# Patient Record
Sex: Female | Born: 1975 | Race: White | Hispanic: No | Marital: Married
Health system: Southern US, Community
[De-identification: ages and names within clinical notes are randomized; demographics above are authoritative.]

---

## 2003-07-15 ENCOUNTER — Emergency Department (HOSPITAL_COMMUNITY): Admission: EM | Admit: 2003-07-15 | Discharge: 2003-07-15 | Payer: Self-pay | Admitting: *Deleted

## 2004-12-01 ENCOUNTER — Emergency Department: Payer: Self-pay | Admitting: Emergency Medicine

## 2005-02-26 ENCOUNTER — Emergency Department (HOSPITAL_COMMUNITY): Admission: EM | Admit: 2005-02-26 | Discharge: 2005-02-26 | Payer: Self-pay | Admitting: Emergency Medicine

## 2005-02-27 ENCOUNTER — Ambulatory Visit: Payer: Self-pay | Admitting: Psychiatry

## 2005-02-27 ENCOUNTER — Inpatient Hospital Stay (HOSPITAL_COMMUNITY): Admission: EM | Admit: 2005-02-27 | Discharge: 2005-03-02 | Payer: Self-pay | Admitting: Psychiatry

## 2005-12-09 ENCOUNTER — Emergency Department: Payer: Self-pay | Admitting: Emergency Medicine

## 2006-03-02 ENCOUNTER — Inpatient Hospital Stay (HOSPITAL_COMMUNITY): Admission: RE | Admit: 2006-03-02 | Discharge: 2006-03-06 | Payer: Self-pay | Admitting: Psychiatry

## 2006-03-03 ENCOUNTER — Ambulatory Visit: Payer: Self-pay | Admitting: Psychiatry

## 2006-05-20 ENCOUNTER — Ambulatory Visit: Payer: Self-pay | Admitting: Obstetrics and Gynecology

## 2007-03-09 ENCOUNTER — Emergency Department: Payer: Self-pay | Admitting: Emergency Medicine

## 2007-03-22 ENCOUNTER — Emergency Department: Payer: Self-pay | Admitting: Emergency Medicine

## 2007-05-18 ENCOUNTER — Observation Stay: Payer: Self-pay

## 2007-07-04 ENCOUNTER — Encounter: Payer: Self-pay | Admitting: Maternal & Fetal Medicine

## 2007-07-14 ENCOUNTER — Observation Stay: Payer: Self-pay | Admitting: Unknown Physician Specialty

## 2007-11-28 ENCOUNTER — Emergency Department: Payer: Self-pay | Admitting: Emergency Medicine

## 2008-03-10 ENCOUNTER — Emergency Department: Payer: Self-pay | Admitting: Emergency Medicine

## 2008-03-12 ENCOUNTER — Emergency Department: Payer: Self-pay | Admitting: Emergency Medicine

## 2008-04-23 ENCOUNTER — Emergency Department: Payer: Self-pay | Admitting: Emergency Medicine

## 2008-04-24 ENCOUNTER — Emergency Department: Payer: Self-pay | Admitting: Emergency Medicine

## 2008-07-11 ENCOUNTER — Emergency Department: Payer: Self-pay | Admitting: Emergency Medicine

## 2008-07-16 ENCOUNTER — Emergency Department: Payer: Self-pay | Admitting: Emergency Medicine

## 2008-07-25 ENCOUNTER — Emergency Department: Payer: Self-pay | Admitting: Emergency Medicine

## 2008-09-23 ENCOUNTER — Emergency Department: Payer: Self-pay | Admitting: Emergency Medicine

## 2008-10-12 ENCOUNTER — Emergency Department: Payer: Self-pay | Admitting: Emergency Medicine

## 2009-01-23 ENCOUNTER — Emergency Department: Payer: Self-pay | Admitting: Emergency Medicine

## 2010-05-07 ENCOUNTER — Emergency Department: Payer: Self-pay | Admitting: Emergency Medicine

## 2010-05-15 ENCOUNTER — Emergency Department: Payer: Self-pay | Admitting: Emergency Medicine

## 2010-06-03 ENCOUNTER — Emergency Department: Payer: Self-pay | Admitting: Emergency Medicine

## 2010-07-12 ENCOUNTER — Emergency Department: Payer: Self-pay | Admitting: Emergency Medicine

## 2010-08-11 ENCOUNTER — Emergency Department: Payer: Self-pay | Admitting: Unknown Physician Specialty

## 2011-01-11 ENCOUNTER — Emergency Department: Payer: Self-pay | Admitting: Unknown Physician Specialty

## 2011-04-26 ENCOUNTER — Emergency Department: Payer: Self-pay | Admitting: Emergency Medicine

## 2011-06-02 ENCOUNTER — Inpatient Hospital Stay: Payer: Self-pay | Admitting: Psychiatry

## 2011-07-05 ENCOUNTER — Emergency Department: Payer: Self-pay | Admitting: Emergency Medicine

## 2011-08-07 ENCOUNTER — Emergency Department: Payer: Self-pay | Admitting: *Deleted

## 2011-09-22 ENCOUNTER — Emergency Department: Payer: Self-pay | Admitting: Unknown Physician Specialty

## 2011-10-02 ENCOUNTER — Inpatient Hospital Stay: Payer: Self-pay | Admitting: Obstetrics and Gynecology

## 2012-01-24 ENCOUNTER — Observation Stay: Payer: Self-pay

## 2012-01-24 LAB — URINALYSIS, COMPLETE
Blood: NEGATIVE
Glucose,UR: NEGATIVE mg/dL (ref 0–75)
Ph: 6 (ref 4.5–8.0)
Protein: NEGATIVE
RBC,UR: 2 /HPF (ref 0–5)
Specific Gravity: 1.014 (ref 1.003–1.030)
Squamous Epithelial: 9
WBC UR: 4 /HPF (ref 0–5)

## 2012-02-03 IMAGING — CT CT STONE STUDY
1 of 2 series · 15 of 32 positions shown, 19 images · non-contrast
Comparison: none

REASON FOR EXAM: back pain radiating around to l flank, dysuria
COMMENTS:

[Series 2: stone · axial · 0.83mm/px · z∈[+284,+732]mm · 15 of 163 slices shown, 19 images]
[im 7/163  soft-tissue]
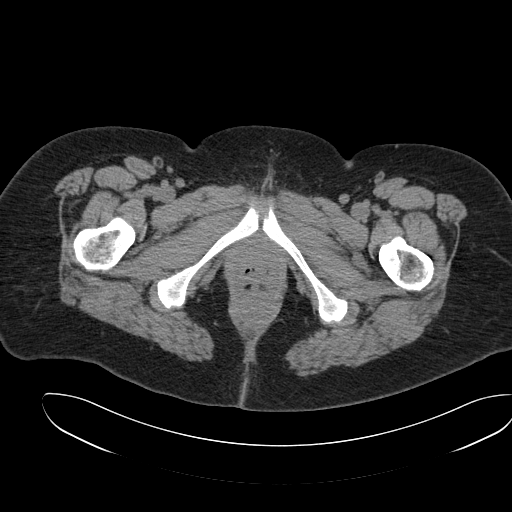
[im 7/163  bone]
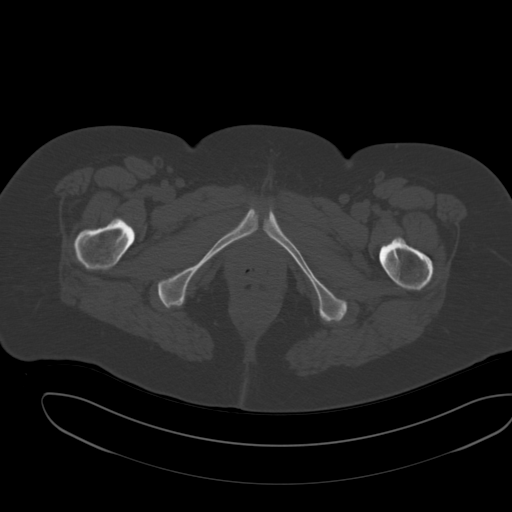
[im 20/163  soft-tissue]
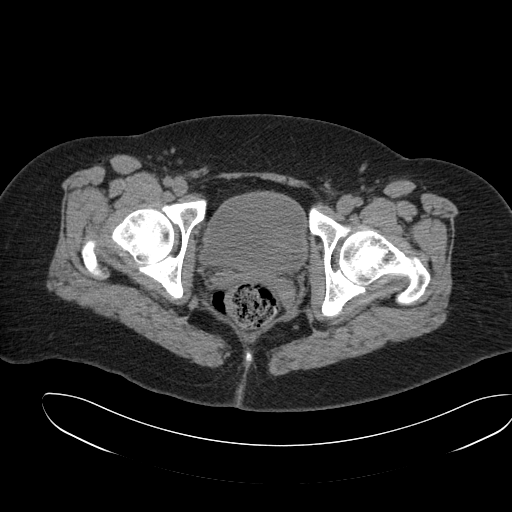
[im 33/163  soft-tissue]
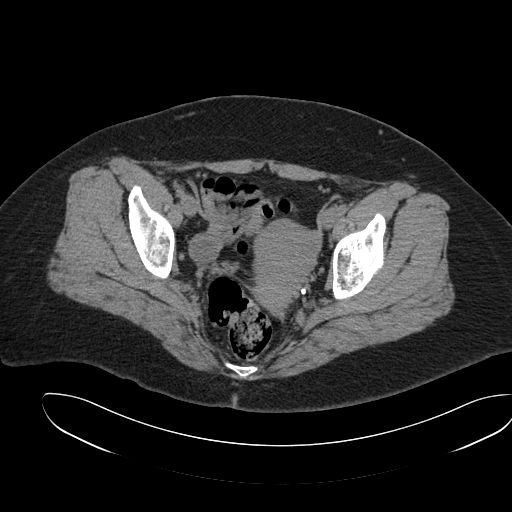
[im 46/163  soft-tissue]
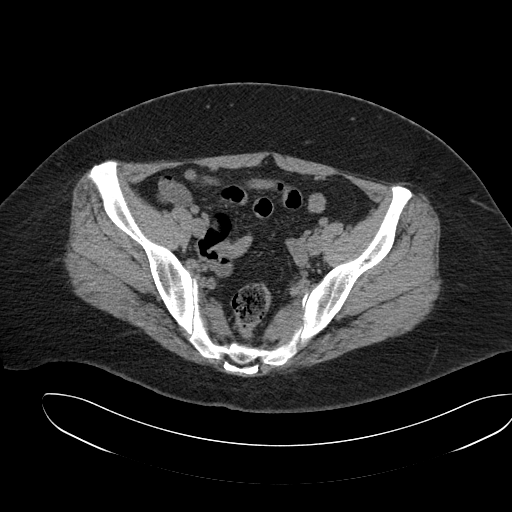
[im 59/163  soft-tissue]
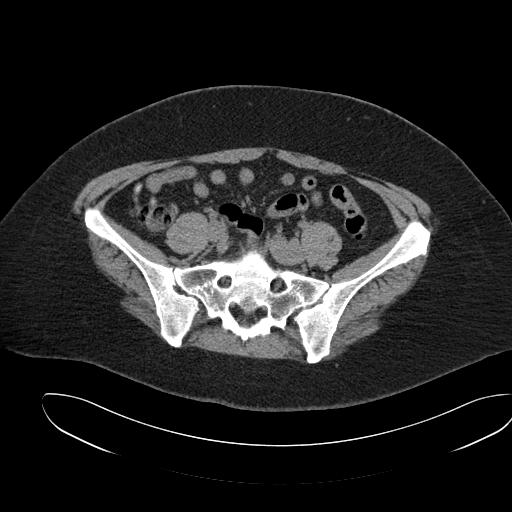
[im 72/163  soft-tissue]
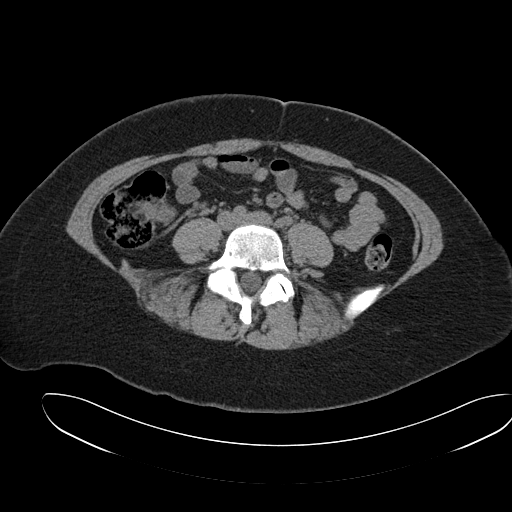
[im 85/163  soft-tissue]
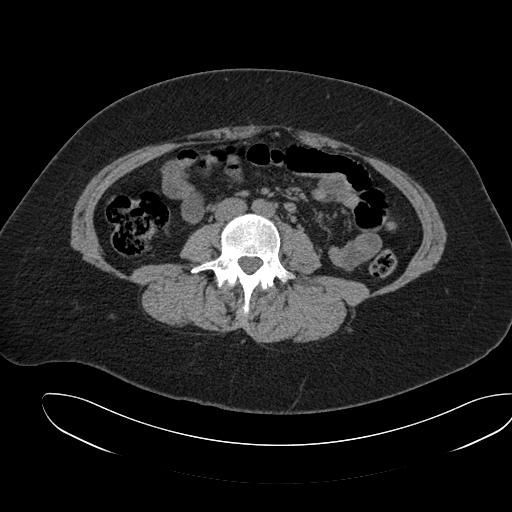
[im 91/163  soft-tissue]
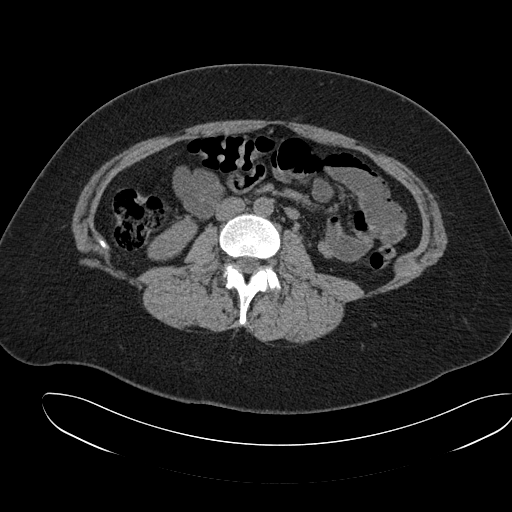
[im 104/163  soft-tissue]
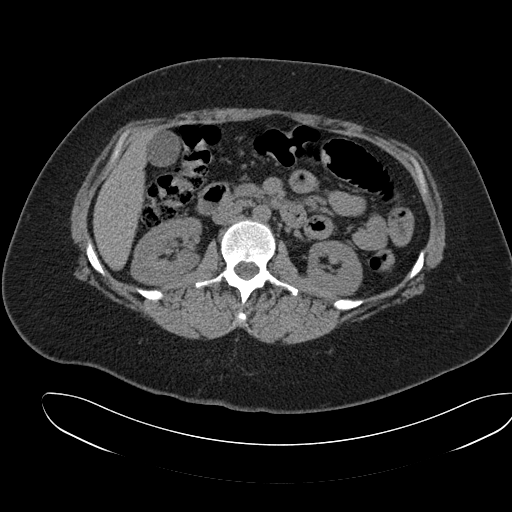
[im 104/163  bone]
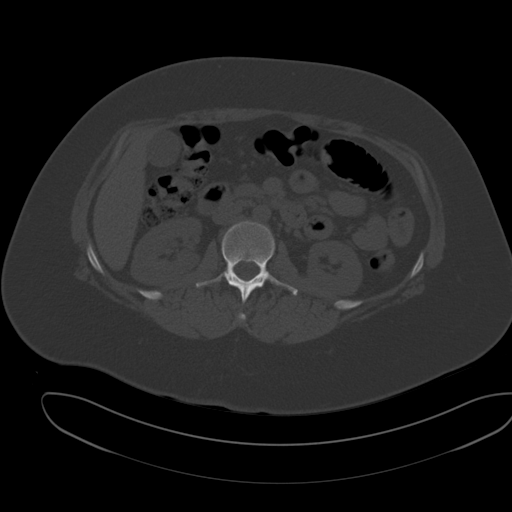
[im 117/163  soft-tissue]
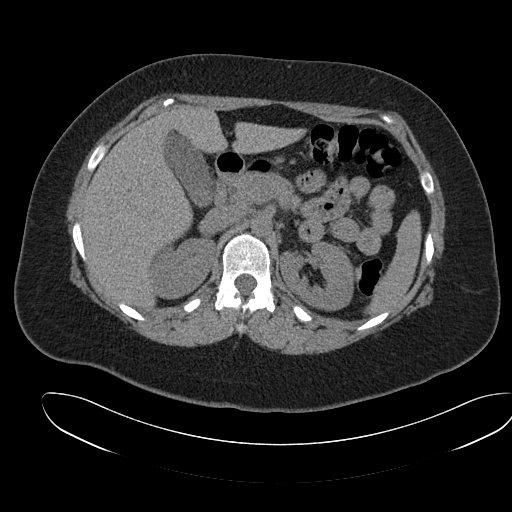
[im 130/163  soft-tissue]
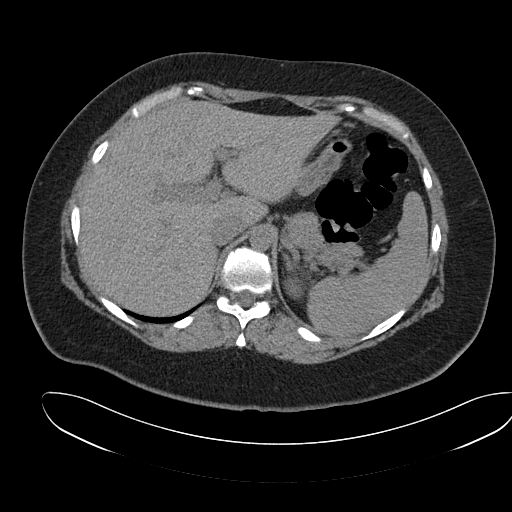
[im 137/163  lung]
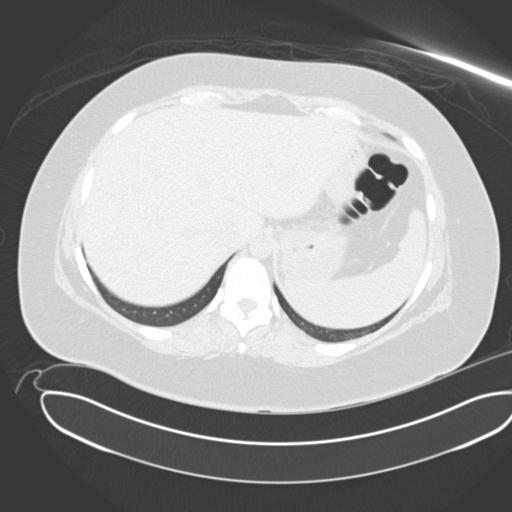
[im 143/163  soft-tissue]
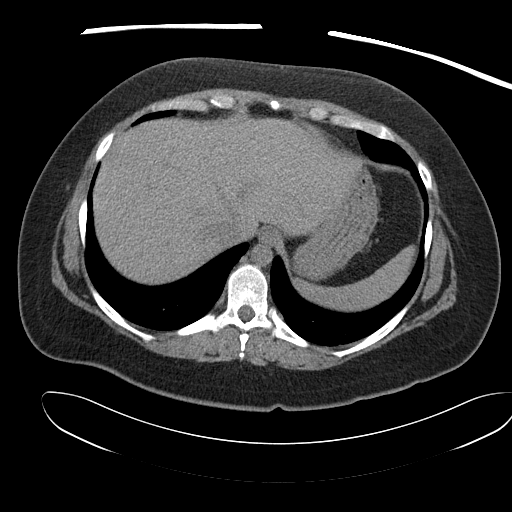
[im 143/163  lung]
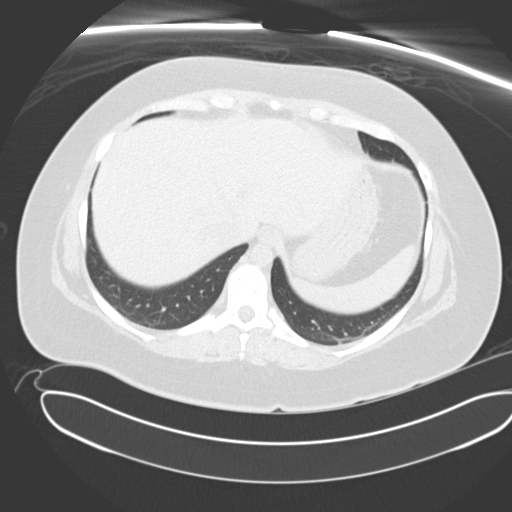
[im 150/163  lung]
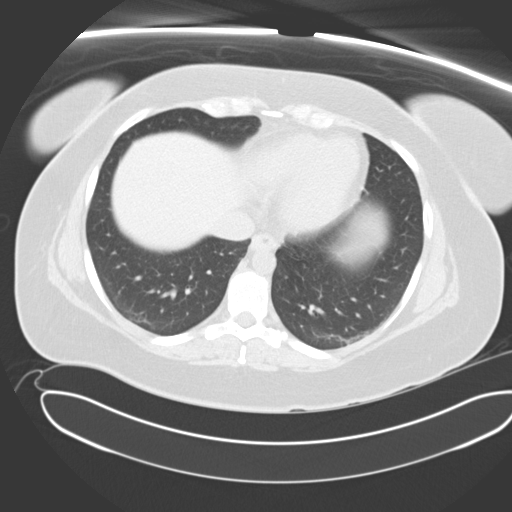
[im 156/163  soft-tissue]
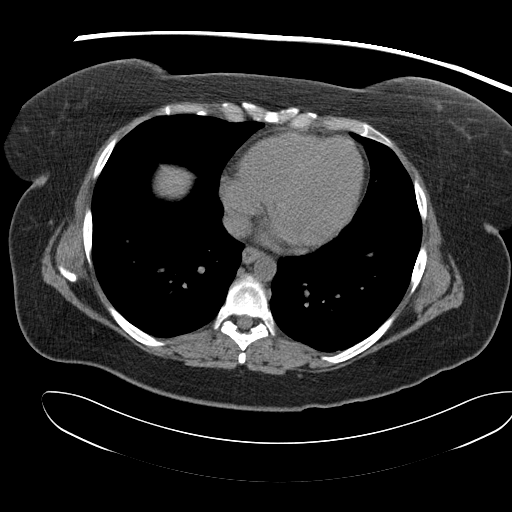
[im 156/163  lung]
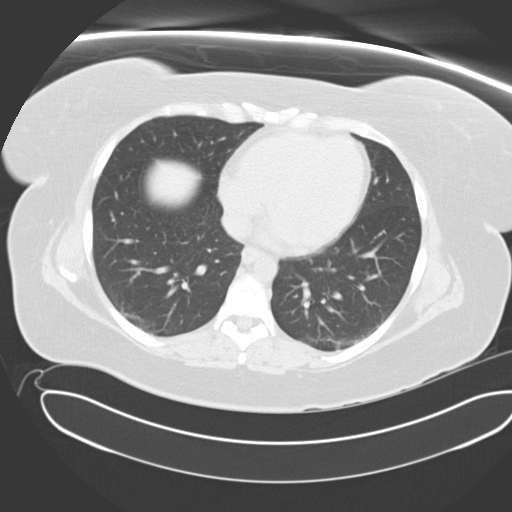

[15 of 32 positions shown; findings below may reference images not displayed]

PROCEDURE:     CT  - CT ABDOMEN /PELVIS WO (STONE)  - July 12, 2010  [DATE]

RESULT:     Axial noncontrast CT scanning was performed through the abdomen
and pelvis at 3 mm intervals and slice thicknesses. Review of multiplanar
reconstructed images was performed separately on the VIA monitor.

The kidneys exhibit no evidence of obstruction nor of calcified stones. The
perinephric fat is normal in density with no abnormal fluid collections or
inflammatory type changes identified. Along the expected course of the
ureters I see no calcified stones. The partially distended urinary bladder
is normal in appearance.

On the right in the adnexal region there is a 3 cm diameter hypodensity
which may reflect the clinically known ovarian cyst. I do not see and
adnexal mass on the left. The uterus is situated slightly to the left of
midline but exhibits no acute abnormality. I see no free fluid within the
abdomen or pelvis. There is no pelvic sidewall lymphadenopathy.

The unopacified loops of small and large bowel exhibit no evidence of
obstruction. A normal appendix is identified. There are no findings to
suggest colitis. There is no inguinal nor umbilical hernia. The liver,
gallbladder, pancreas, spleen, adrenal glands, and periaortic and pericaval
regions are normal in appearance. The lung bases exhibit no acute
abnormality. The lumbar vertebral bodies are preserved in height.
IMPRESSION: 1. I do not see evidence of acute urinary tract abnormality.
2. There is no evidence of acute hepatobiliary abnormality nor acute bowel
abnormality.
3. There is a 3 cm diameter right adnexal soft tissue mass which may reflect
the clinically known ovarian cyst. This could be evaluated further with
ultrasound.

A preliminary report was sent to the [HOSPITAL] the conclusion
of the study.

## 2012-02-10 ENCOUNTER — Observation Stay: Payer: Self-pay

## 2012-02-10 LAB — URINALYSIS, COMPLETE
Leukocyte Esterase: NEGATIVE
Ph: 5 (ref 4.5–8.0)
Protein: 30
RBC,UR: 6 /HPF (ref 0–5)

## 2012-02-11 LAB — CBC WITH DIFFERENTIAL/PLATELET
Basophil #: 0 10*3/uL (ref 0.0–0.1)
Eosinophil #: 0.1 10*3/uL (ref 0.0–0.7)
HCT: 34.1 % — ABNORMAL LOW (ref 35.0–47.0)
Lymphocyte #: 1.6 10*3/uL (ref 1.0–3.6)
MCH: 33.8 pg (ref 26.0–34.0)
MCHC: 34.3 g/dL (ref 32.0–36.0)
MCV: 99 fL (ref 80–100)
Monocyte #: 0.4 10*3/uL (ref 0.0–0.7)
Monocyte %: 6.5 %
Neutrophil #: 4 10*3/uL (ref 1.4–6.5)
Neutrophil %: 66 %
Platelet: 129 10*3/uL — ABNORMAL LOW (ref 150–440)
RBC: 3.47 10*6/uL — ABNORMAL LOW (ref 3.80–5.20)
RDW: 13.9 % (ref 11.5–14.5)
WBC: 6.1 10*3/uL (ref 3.6–11.0)

## 2012-02-11 LAB — DRUG SCREEN, URINE
Amphetamines, Ur Screen: NEGATIVE (ref ?–1000)
Barbiturates, Ur Screen: NEGATIVE (ref ?–200)
Benzodiazepine, Ur Scrn: POSITIVE (ref ?–200)
Cocaine Metabolite,Ur ~~LOC~~: NEGATIVE (ref ?–300)
Methadone, Ur Screen: NEGATIVE (ref ?–300)
Opiate, Ur Screen: POSITIVE (ref ?–300)
Tricyclic, Ur Screen: NEGATIVE (ref ?–1000)

## 2012-02-11 LAB — RAPID HIV-1/2 QL/CONFIRM: HIV-1/2,Rapid Ql: NEGATIVE

## 2012-02-11 LAB — ETHANOL: Ethanol: <3 mg/dL

## 2012-02-15 ENCOUNTER — Inpatient Hospital Stay: Payer: Self-pay

## 2012-02-15 LAB — CBC WITH DIFFERENTIAL/PLATELET
Basophil %: 0.1 %
Eosinophil #: 0 10*3/uL (ref 0.0–0.7)
HGB: 12.6 g/dL (ref 12.0–16.0)
Lymphocyte %: 16.2 %
MCHC: 33.7 g/dL (ref 32.0–36.0)
Monocyte #: 0.5 10*3/uL (ref 0.0–0.7)
Neutrophil #: 6.3 10*3/uL (ref 1.4–6.5)
Platelet: 130 10*3/uL — ABNORMAL LOW (ref 150–440)
RDW: 13.6 % (ref 11.5–14.5)
WBC: 8.2 10*3/uL (ref 3.6–11.0)

## 2012-02-15 LAB — DRUG SCREEN, URINE
Barbiturates, Ur Screen: NEGATIVE (ref ?–200)
Benzodiazepine, Ur Scrn: POSITIVE (ref ?–200)
Cannabinoid 50 Ng, Ur ~~LOC~~: NEGATIVE (ref ?–50)
MDMA (Ecstasy)Ur Screen: NEGATIVE (ref ?–500)
Opiate, Ur Screen: POSITIVE (ref ?–300)

## 2013-04-15 IMAGING — US US OB US >=[ID] SNGL FETUS
1 series · 17 of 28 positions shown · non-contrast
Comparison: none

RESULT:      OB ultrasound of the pelvis demonstrates a single intrauterine
fetus. The placenta is anterior in position. Fetal cardiac activity is
measured at 141 beats per minute. The length of the cervix as measured at
4.17 cm. The inferior margin of the placenta is 2.54 cm away from the
internal cervical os. Fetal anatomy appears to be grossly normal. There is a
cephalic presentation at the time of imaging. The amniotic fluid volume is
visually normal. The length of the cervix after voiding was measured at
cm. Two umbilical arteries are present. Gestational age sonographically is
19 weeks 2 days plus or minus a standard deviation of 10 to 14 days.
Ultrasound EDD is 02/14/2012. Current estimated fetal weight is 312 grams + / -
42 grams.

[Series 1: us ob us >=(id) sngl fetus · 17 of 76 slices shown]
[im 1/76]
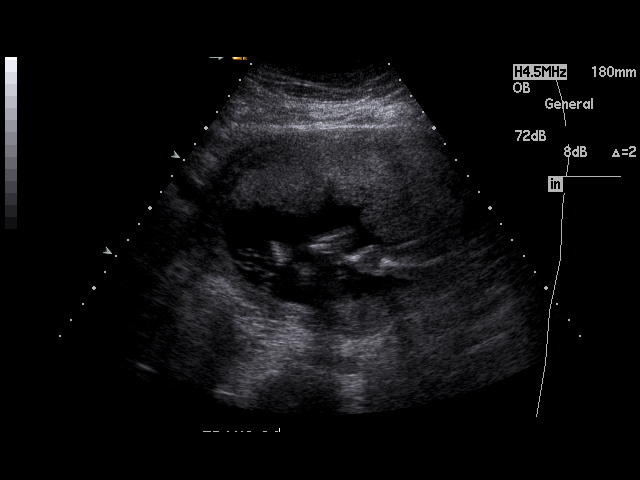
[im 6/76]
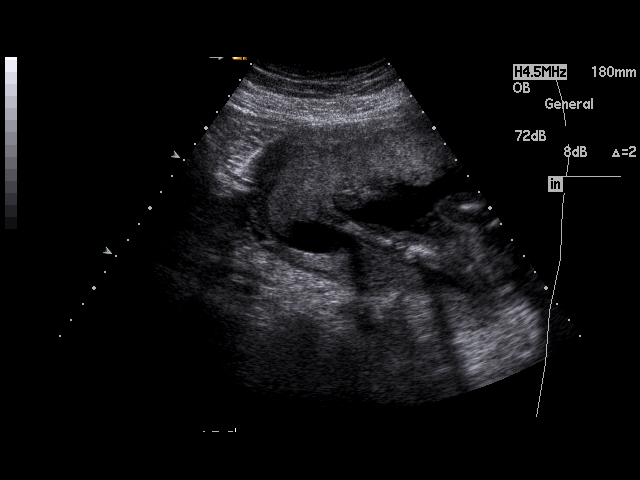
[im 12/76]
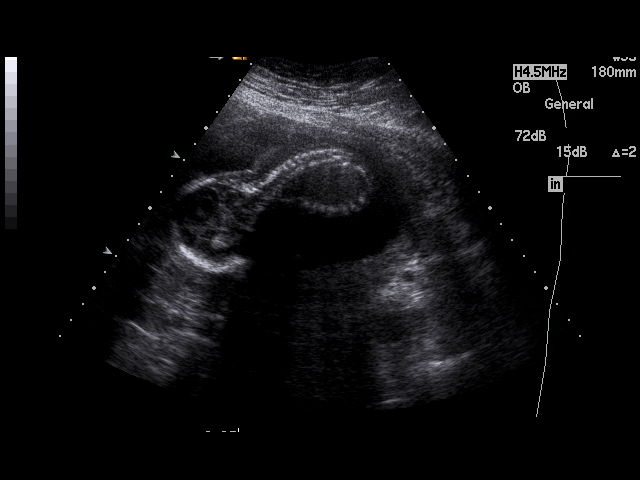
[im 14/76]
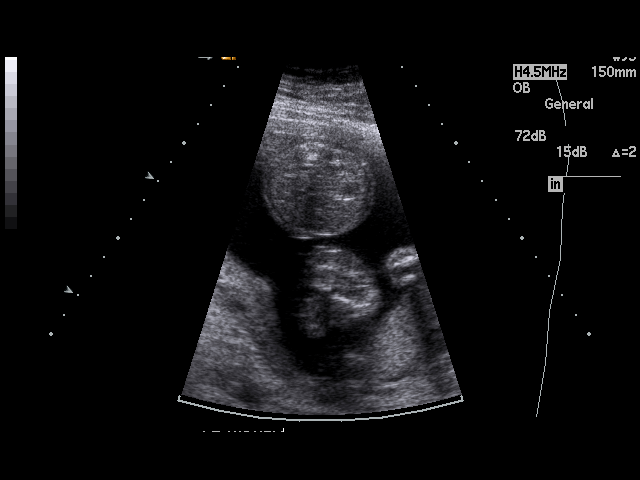
[im 20/76]
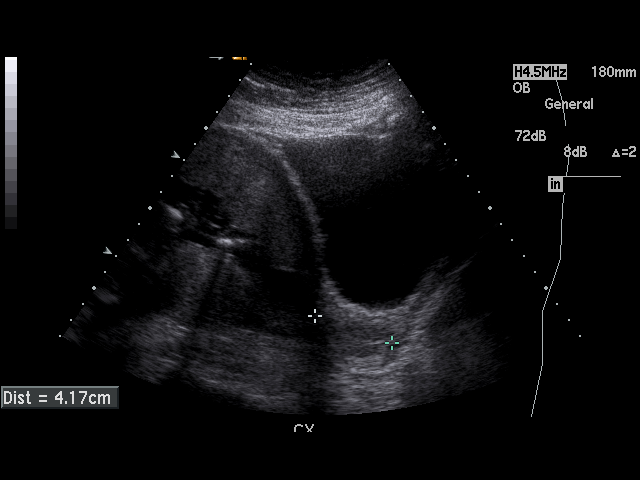
[im 26/76]
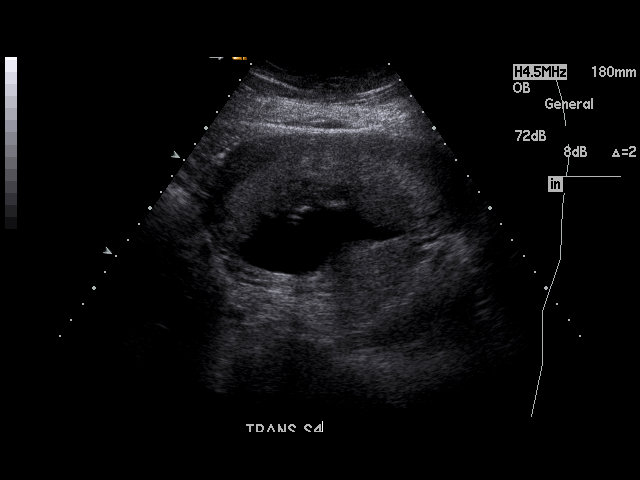
[im 28/76]
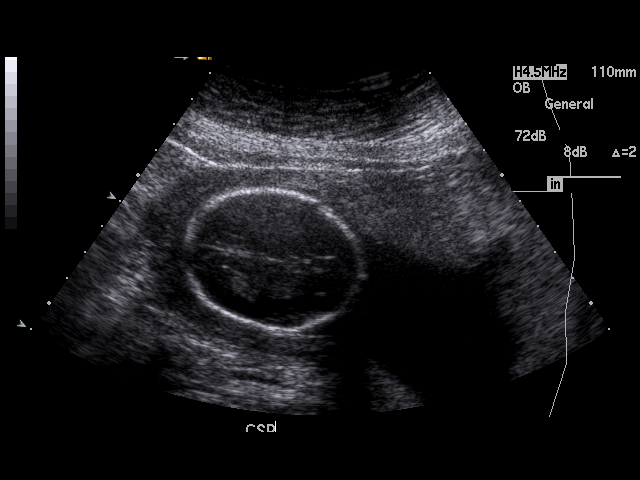
[im 34/76]
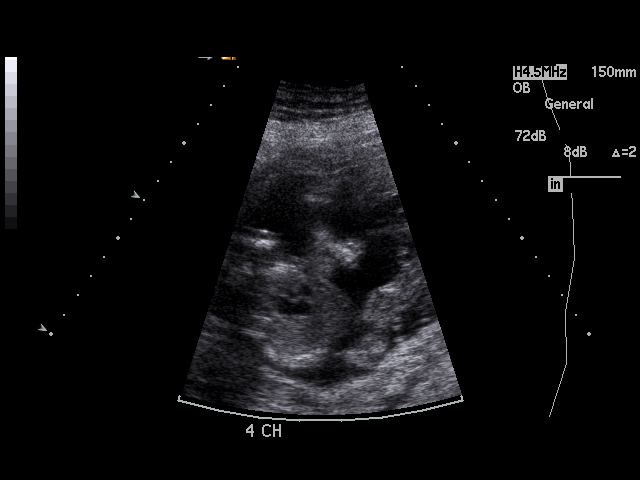
[im 39/76]
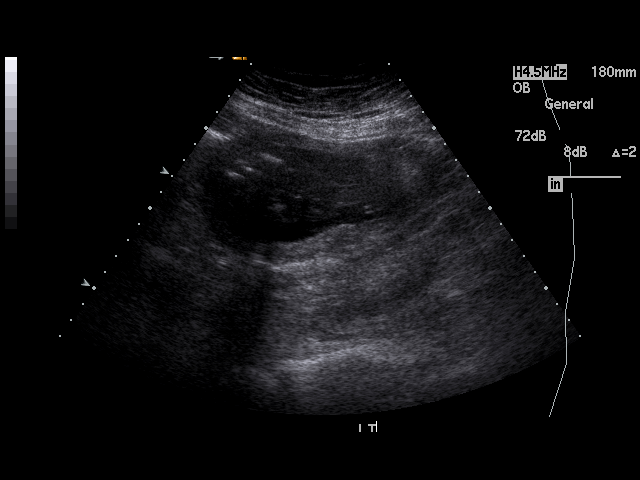
[im 42/76]
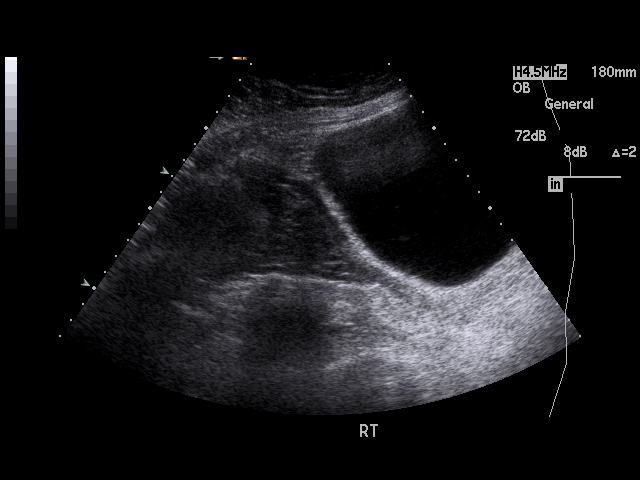
[im 48/76]
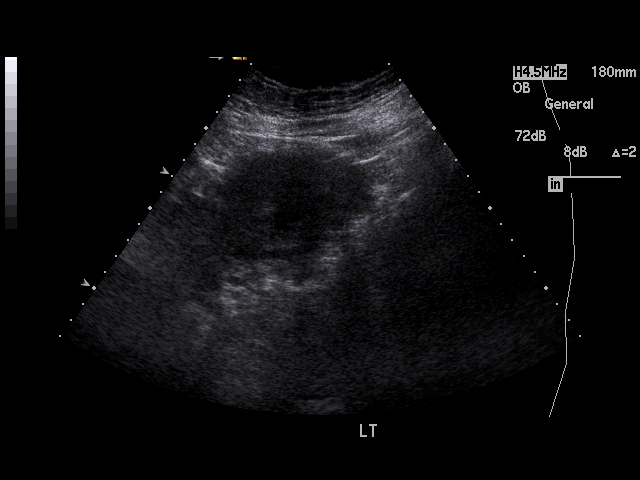
[im 51/76]
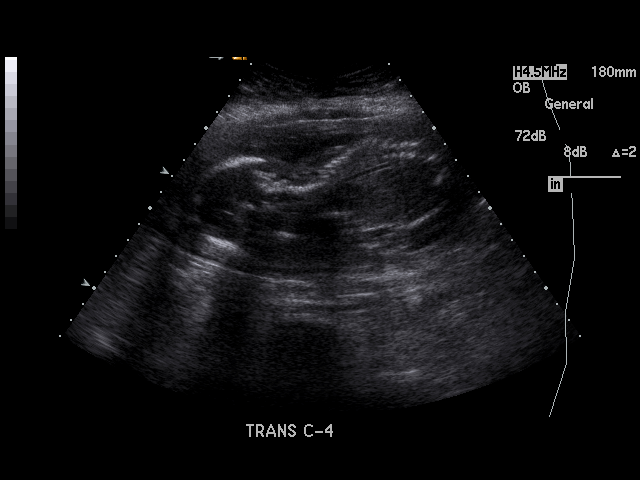
[im 56/76]
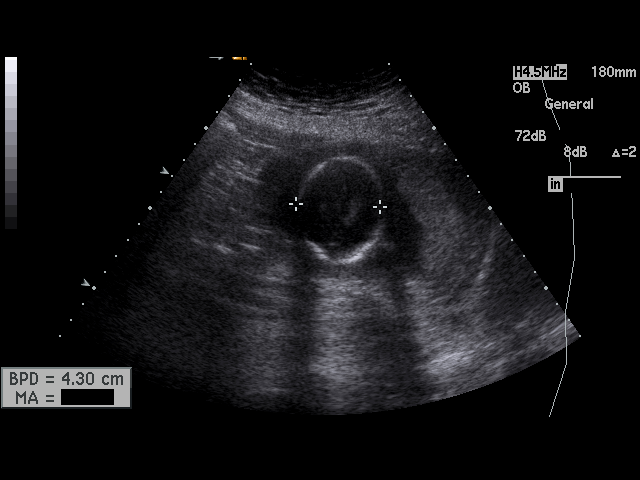
[im 62/76]
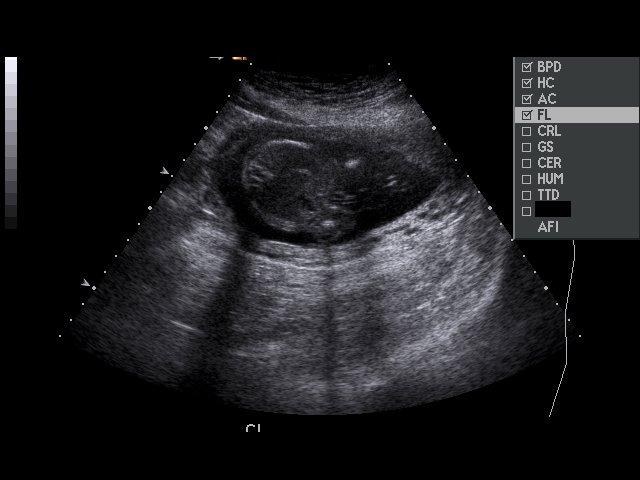
[im 64/76]
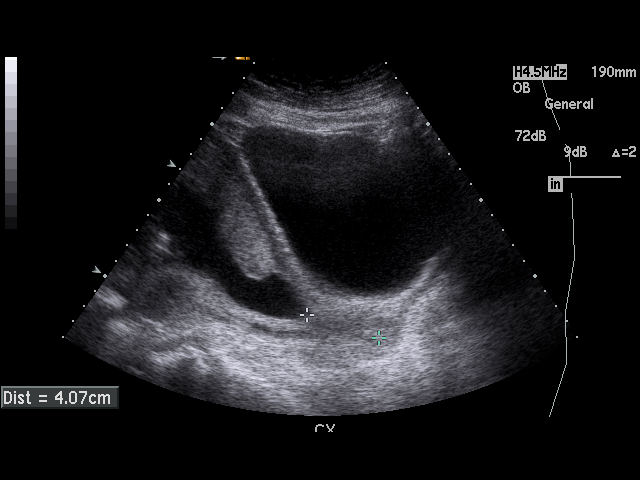
[im 70/76]
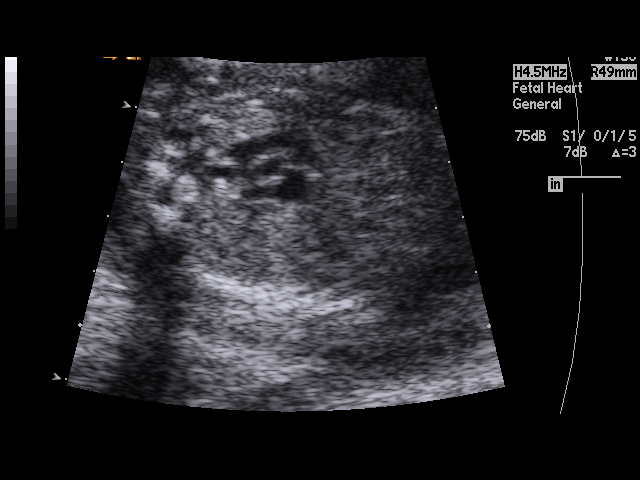
[im 76/76]
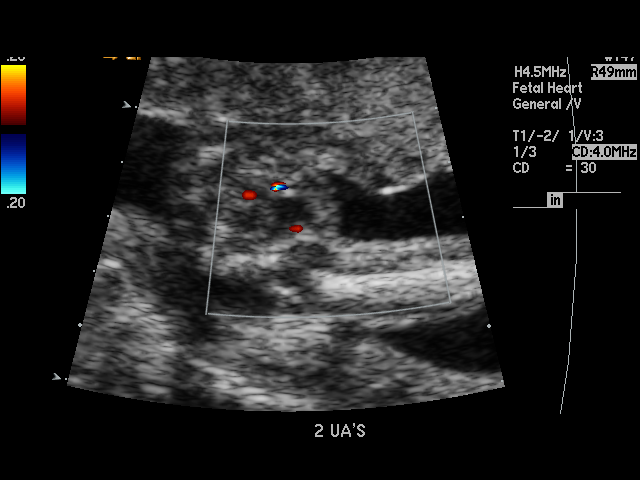

[17 of 28 positions shown; findings below may reference images not displayed]

IMPRESSION: No significant abnormality evident.
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
## 2015-03-24 NOTE — Consult Note (Signed)
Patient is clear for discharge and baby care from psych perspective.meds and full note (to follow).  Electronic Signatures: Lester CarolinaWilliford, Mayukha Symmonds S (MD)  (Signed on 19-Mar-13 19:00)  Authored  Last Updated: 19-Mar-13 19:00 by Lester CarolinaWilliford, Shanelle Clontz S (MD)

## 2015-03-24 NOTE — Discharge Summary (Signed)
PATIENT NAME:  Krista Nicholson, Krista Nicholson MR#:  [id number] DATE OF BIRTH:  Jun 05, 1976  DATE OF ADMISSION:  02/10/2012 DATE OF DISCHARGE:  02/11/2012  HOSPITAL COURSE: The patient was admitted late last evening for reports of spotting, was found to be in a drug-induced stupor with positive benzos, opioids, and marijuana.   She has had three prenatal visits, all through our Emergency Room beginning in December, once in February and then again last evening. She has been seen by Dr. Greggory KeeneFrancesco, then Acquanetta BellingAngela Lugiano, CNM the second time, initially Dr. Greggory KeeneFrancesco last evening and then he insisted she be transferred to the [facility] service since we saw her last.   On presentation she was stuporous. The nurses on Labor and Delivery were concerned because they weren't sure if she was breathing regularly or not despite a good pulse oximetry. Infant was reassuring and, therefore, hospitalist were consulted and she was transferred to telemetry.   Throughout the day today, then talking with [facility] and the social workers trying to decide what to do with this lady with what appears to be a 39 week, 2 day gestation (based on 20 week ultrasound). The patient is in denial of drug abuse but states she has been trying to get in touch with [facility] throughout and is interested in becoming established.   Social Worker spoke with her today and the patient was quite upset of the insinuation that she was abusing drugs saying that "all of my medicines are prescription".   This afternoon she was transferred back up to Labor and Delivery. Fetal monitoring shows no significant contractions. Fetal heart tracing is reactive and cervical exam shows her to be 2/50/high. There is some old dark mucous consistent with old blood. No active bleeding noted.   I discussed with the patient about ideally we would get her involved in some sort of drug program soon, like as in this weekend, and considering delivering [facility] next week[facility]. She is very interested in doing this.   I offered to keep her until in the morning so we can get her all established before she leaves, however, she is very concerned about her children at home and wants to get home. She feels like she needs to make it clear to them that they are going to have another baby. She doesn't feel that they understand that.   She is accompanied by her sister who swears that "she won't do nothing while I'm there". Krista Nicholson has verbally contracted with me that the do not take anything outside of prescribed medications at home in any other manner in which they were prescribed and that if she feels like she is having trouble or being tempted or if contractions or any concerns at all she is to return immediately.   ____________________________ Reatha Harpsicky L. Logan BoresEvans, MD rle:drc D: 02/11/2012 17:24:35 ET T: 02/12/2012 10:26:16 ET JOB#: [id number]  cc: Clide Clifficky L. Logan BoresEvans, MD, <Dictator> Augustina MoodICK L Jacobo Moncrief MD ELECTRONICALLY SIGNED 02/12/2012 12:19
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
